# Patient Record
Sex: Male | Born: 1990 | Race: Black or African American | Hispanic: No | Marital: Single
Health system: Midwestern US, Community
[De-identification: ages and names within clinical notes are randomized; demographics above are authoritative.]

---

## 2013-12-13 ENCOUNTER — Emergency Department (INDEPENDENT_AMBULATORY_CARE_PROVIDER_SITE_OTHER)
Admission: EM | Admit: 2013-12-13 | Discharge: 2013-12-13 | Disposition: A | Discharge status: Home / Self Care | Payer: Self-pay | Source: Home / Self Care | Attending: Family Medicine | Admitting: Family Medicine

## 2013-12-13 ENCOUNTER — Encounter (HOSPITAL_COMMUNITY): Payer: Self-pay | Admitting: Emergency Medicine

## 2013-12-13 DIAGNOSIS — K029 Dental caries, unspecified: Secondary | ICD-10-CM

## 2013-12-13 DIAGNOSIS — G8929 Other chronic pain: Secondary | ICD-10-CM

## 2013-12-13 DIAGNOSIS — K089 Disorder of teeth and supporting structures, unspecified: Secondary | ICD-10-CM

## 2013-12-13 MED ORDER — NAPROXEN 375 MG PO TABS: 375.0000 mg | ORAL_TABLET | Freq: Two times a day (BID) | ORAL | Status: DC

## 2013-12-13 MED ORDER — LIDOCAINE VISCOUS 2 % MT SOLN
OROMUCOSAL | Status: DC
Start: 2013-12-13 — End: 2014-12-11

## 2013-12-13 NOTE — ED Provider Notes (Signed)
CSN: [id number]     Arrival date & time 12/13/13  1636 History   First MD Initiated Contact with Patient 12/13/13 1648     No chief complaint on file.  (Consider location/radiation/quality/duration/timing/severity/associated sxs/prior Treatment) HPI Comments: Patient reports several years of dental discomfort as a result of multiple dental carries. States he has finally become frustrated enough about not having dental provider [facility]. Patient is a smoker. Denies fever, facial swelling, gumline swelling or drainage. Using ibuprofen and tylenol at home with limited relief.   Patient is a 23 y.o. male presenting with tooth pain. The history is provided by the patient.  Dental Pain   No past medical history on file. No past surgical history on file. No family history on file. History  Substance Use Topics  . Smoking status: Not on file  . Smokeless tobacco: Not on file  . Alcohol Use: Not on file    Review of Systems  All other systems reviewed and are negative.    Allergies  Review of patient's allergies indicates not on file.  Home Medications  No current outpatient prescriptions on file. BP 128/81  Pulse 114  Temp(Src) 99 F (37.2 C) (Oral)  Resp 14  SpO2 99% Physical Exam  Nursing note and vitals reviewed. Constitutional: He is oriented to person, place, and time. He appears well-developed and well-nourished. No distress.  HENT:  Head: Normocephalic and atraumatic.  Right Ear: External ear normal.  Left Ear: External ear normal.  Nose: Nose normal.  Mouth/Throat: Uvula is midline and oropharynx is clear and moist. He does not have dentures. No oral lesions. No trismus in the jaw. Dental caries present. No dental abscesses, uvula swelling or lacerations.  Multiple dental carries, both upper and lower teeth.   Eyes: Conjunctivae are normal. No scleral icterus.  Neck: Normal range of motion. Neck supple.  Cardiovascular: Normal rate.    Pulmonary/Chest: Effort normal.  Lymphadenopathy:    He has no cervical adenopathy.  Neurological: He is alert and oriented to person, place, and time.  Skin: Skin is warm and dry.  Psychiatric: He has a normal mood and affect. His behavior is normal.    ED Course  Procedures (including critical care time) Labs Review Labs Reviewed - No data to display Imaging Review No results found.  EKG Interpretation    Date/Time:    Ventricular Rate:    PR Interval:    QRS Duration:   QT Interval:    QTC Calculation:   R Axis:     Text Interpretation:              MDM  Will provide Rx for viscous lidocaine and naprosyn and provide contact information for local dentist. No clinical evidence of dental abscess.   Jess BartersJennifer Lee Falcon Heights[place], [place] 12/13/13 [phone]

## 2013-12-13 NOTE — Discharge Instructions (Signed)
Dental Care and Dentist Visits Dental care supports good overall health. Regular dental visits can also help you avoid dental pain, bleeding, infection, and other more serious health problems in the future. It is important to keep the mouth healthy because diseases in the teeth, gums, and other oral tissues can spread to other areas of the body. Some problems, such as diabetes, heart disease, and pre-term labor have been associated with poor oral health.  See your dentist every 6 months. If you experience emergency problems such as a toothache or broken tooth, go to the dentist right away. If you see your dentist regularly, you may catch problems early. It is easier to be treated for problems in the early stages.  WHAT TO EXPECT AT A DENTIST VISIT  Your dentist will look for many common oral health problems and recommend proper treatment. At your regular dental visit, you can expect:  Gentle cleaning of the teeth and gums. This includes scraping and polishing. This helps to remove the sticky substance around the teeth and gums (plaque). Plaque forms in the mouth shortly after eating. Over time, plaque hardens on the teeth as tartar. If tartar is not removed regularly, it can cause problems. Cleaning also helps remove stains.  Periodic X-rays. These pictures of the teeth and supporting bone will help your dentist assess the health of your teeth.  Periodic fluoride treatments. Fluoride is a natural mineral shown to help strengthen teeth. Fluoride treatmentinvolves applying a fluoride gel or varnish to the teeth. It is most commonly done in children.  Examination of the mouth, tongue, jaws, teeth, and gums to look for any oral health problems, such as:  Cavities (dental caries). This is decay on the tooth caused by plaque, sugar, and acid in the mouth. It is best to catch a cavity when it is small.  Inflammation of the gums caused by plaque buildup (gingivitis).  Problems with the mouth or malformed  or misaligned teeth.  Oral cancer or other diseases of the soft tissues or jaws. KEEP YOUR TEETH AND GUMS HEALTHY For healthy teeth and gums, follow these general guidelines as well as your dentist's specific advice:  Have your teeth professionally cleaned at the dentist every 6 months.  Brush twice daily with a fluoride toothpaste.  Floss your teeth daily.  Ask your dentist if you need fluoride supplements, treatments, or fluoride toothpaste.  Eat a healthy diet. Reduce foods and drinks with added sugar.  Avoid smoking. TREATMENT FOR ORAL HEALTH PROBLEMS If you have oral health problems, treatment varies depending on the conditions present in your teeth and gums.  Your caregiver will most likely recommend good oral hygiene at each visit.  For cavities, gingivitis, or other oral health disease, your caregiver will perform a procedure to treat the problem. This is typically done at a separate appointment. Sometimes your caregiver will refer you to another dental specialist for specific tooth problems or for surgery. SEEK IMMEDIATE DENTAL CARE IF:  You have pain, bleeding, or soreness in the gum, tooth, jaw, or mouth area.  A permanent tooth becomes loose or separated from the gum socket.  You experience a blow or injury to the mouth or jaw area. Document Released: 07/16/2011 Document Revised: 01/26/2012 Document Reviewed: 07/16/2011 [employer] Patient Information 2014 [employer].  

## 2013-12-13 NOTE — ED Notes (Signed)
C/o  Dental pain off/on.  More severe pain in the past two weeks and causing headaches.  No relief with otc pain meds.

## 2013-12-15 NOTE — ED Provider Notes (Signed)
Medical screening examination/treatment/procedure(s) were performed by resident physician or non-physician practitioner and as supervising physician I was immediately available for consultation/collaboration.   Caitlain Tweed DOUGLAS MD.   Gaberial Cada D Shawnell Dykes, MD 12/15/13 2018 

## 2014-12-11 ENCOUNTER — Emergency Department (HOSPITAL_BASED_OUTPATIENT_CLINIC_OR_DEPARTMENT_OTHER): Payer: No Typology Code available for payment source

## 2014-12-11 ENCOUNTER — Emergency Department (HOSPITAL_BASED_OUTPATIENT_CLINIC_OR_DEPARTMENT_OTHER)
Admission: EM | Admit: 2014-12-11 | Discharge: 2014-12-11 | Disposition: A | Discharge status: Home / Self Care | Payer: No Typology Code available for payment source | Attending: Emergency Medicine | Admitting: Emergency Medicine

## 2014-12-11 ENCOUNTER — Encounter (HOSPITAL_BASED_OUTPATIENT_CLINIC_OR_DEPARTMENT_OTHER): Payer: Self-pay | Admitting: *Deleted

## 2014-12-11 DIAGNOSIS — S3992XA Unspecified injury of lower back, initial encounter: Secondary | ICD-10-CM

## 2014-12-11 DIAGNOSIS — Y9289 Other specified places as the place of occurrence of the external cause: Secondary | ICD-10-CM | POA: Insufficient documentation

## 2014-12-11 DIAGNOSIS — Y9389 Activity, other specified: Secondary | ICD-10-CM | POA: Insufficient documentation

## 2014-12-11 DIAGNOSIS — S199XXA Unspecified injury of neck, initial encounter: Secondary | ICD-10-CM | POA: Insufficient documentation

## 2014-12-11 DIAGNOSIS — Z720 Tobacco use: Secondary | ICD-10-CM | POA: Insufficient documentation

## 2014-12-11 DIAGNOSIS — Z79899 Other long term (current) drug therapy: Secondary | ICD-10-CM | POA: Insufficient documentation

## 2014-12-11 DIAGNOSIS — Y998 Other external cause status: Secondary | ICD-10-CM | POA: Insufficient documentation

## 2014-12-11 DIAGNOSIS — K029 Dental caries, unspecified: Secondary | ICD-10-CM | POA: Insufficient documentation

## 2014-12-11 MED ORDER — CYCLOBENZAPRINE HCL 10 MG PO TABS
5.0000 mg | ORAL_TABLET | Freq: Once | ORAL | Status: AC
  Administered 2014-12-11: 5 mg via ORAL
  Filled 2014-12-11: qty 1

## 2014-12-11 MED ORDER — HYDROCODONE-ACETAMINOPHEN 5-325 MG PO TABS
2.0000 | ORAL_TABLET | Freq: Once | ORAL | Status: AC
  Administered 2014-12-11: 2 via ORAL
  Filled 2014-12-11: qty 2

## 2014-12-11 MED ORDER — IBUPROFEN 800 MG PO TABS
800.0000 mg | ORAL_TABLET | Freq: Once | ORAL | Status: AC
  Administered 2014-12-11: 800 mg via ORAL
  Filled 2014-12-11: qty 1

## 2014-12-11 MED ORDER — HYDROCODONE-ACETAMINOPHEN 5-325 MG PO TABS: 1.0000 | ORAL_TABLET | Freq: Four times a day (QID) | ORAL | Status: DC | PRN

## 2014-12-11 MED ORDER — AMOXICILLIN 500 MG PO CAPS
500.0000 mg | ORAL_CAPSULE | Freq: Once | ORAL | Status: AC
  Administered 2014-12-11: 500 mg via ORAL
  Filled 2014-12-11: qty 1

## 2014-12-11 MED ORDER — AMOXICILLIN 500 MG PO CAPS: 1000.0000 mg | ORAL_CAPSULE | Freq: Two times a day (BID) | ORAL | Status: DC
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
## 2014-12-11 NOTE — Discharge Instructions (Signed)
Take motrin for pain.  Take vicodin for severe pain. DO NOT drive with it.   Call dentist for appointment.   Take amoxicillin twice a day for a week.   Return to ER if you have severe pain, vomiting, headache, fever.
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
## 2014-12-11 NOTE — ED Notes (Signed)
MD at bedside. 

## 2014-12-11 NOTE — ED Notes (Signed)
D/c home with ride- rx given for amoxicillin and norco- not for work given

## 2015-01-04 ENCOUNTER — Emergency Department (HOSPITAL_BASED_OUTPATIENT_CLINIC_OR_DEPARTMENT_OTHER)
Admission: EM | Admit: 2015-01-04 | Discharge: 2015-01-04 | Disposition: A | Discharge status: Home / Self Care | Payer: Self-pay | Attending: Emergency Medicine | Admitting: Emergency Medicine

## 2015-01-04 ENCOUNTER — Encounter (HOSPITAL_BASED_OUTPATIENT_CLINIC_OR_DEPARTMENT_OTHER): Payer: Self-pay

## 2015-01-04 DIAGNOSIS — Z720 Tobacco use: Secondary | ICD-10-CM | POA: Insufficient documentation

## 2015-01-04 DIAGNOSIS — R000 Tachycardia, unspecified: Secondary | ICD-10-CM | POA: Insufficient documentation

## 2015-01-04 DIAGNOSIS — K029 Dental caries, unspecified: Secondary | ICD-10-CM | POA: Insufficient documentation

## 2015-01-04 MED ORDER — NAPROXEN 500 MG PO TABS: 500.0000 mg | ORAL_TABLET | Freq: Two times a day (BID) | ORAL | Status: DC

## 2015-01-04 MED ORDER — AMOXICILLIN 500 MG PO CAPS: 500.0000 mg | ORAL_CAPSULE | Freq: Three times a day (TID) | ORAL | Status: DC

## 2015-01-04 NOTE — ED Provider Notes (Signed)
CSN: [id number]     Arrival date & time 01/04/15  1409 History   First MD Initiated Contact with Patient 01/04/15 1527     Chief Complaint  Patient presents with  . Dental Pain     (Consider location/radiation/quality/duration/timing/severity/associated sxs/prior Treatment) Patient is a 24 y.o. male presenting with tooth pain. The history is provided by the patient.  Dental Pain Location:  Lower Lower teeth location:  18/LL 2nd molar, 31/RL 2nd molar, 17/LL 3rd molar and 32/RL 3rd molar  Peter BeachRicky Barnhardt is a 24 y.o. male who presents to the ED with dental pain. He has several teeth that are decayed and causing problems. He states that they hurt so back they are causing his head to hurt.   History reviewed. No pertinent past medical history. History reviewed. No pertinent past surgical history. No family history on file. History  Substance Use Topics  . Smoking status: Current Every Day Smoker -- 1.00 packs/day    Types: Cigarettes  . Smokeless tobacco: Not on file  . Alcohol Use: No    Review of Systems Negative except as stated in HPI   Allergies  Review of patient's allergies indicates no known allergies.  Home Medications   Prior to Admission medications   Medication Sig Start Date End Date Taking? Authorizing Provider  amoxicillin (AMOXIL) 500 MG capsule Take 1 capsule (500 mg total) by mouth 3 (three) times daily. 01/04/15   Hope Orlene OchM Neese, NP  naproxen (NAPROSYN) 500 MG tablet Take 1 tablet (500 mg total) by mouth 2 (two) times daily. 01/04/15   Hope Orlene OchM Neese, NP   BP 129/81 mmHg  Pulse 103  Temp(Src) 97.8 F (36.6 C) (Oral)  Resp 14  Ht 6\' 1"  (1.854 m)  Wt 150 lb (68.04 kg)  BMI 19.79 kg/m2  SpO2 99% Physical Exam  Constitutional: He is oriented to person, place, and time. He appears well-developed and well-nourished. No distress.  HENT:  Head: Normocephalic.  Right Ear: Tympanic membrane normal.  Left Ear: Tympanic membrane normal.  Mouth/Throat: Uvula is  midline and oropharynx is clear and moist. Dental caries present.    Lower second molars decayed, bilateral 3rd molars partially erupted and tender on exam.   Eyes: EOM are normal.  Neck: Neck supple.  Cardiovascular: Tachycardia present.   Pulmonary/Chest: Effort normal.  Musculoskeletal: Normal range of motion.  Lymphadenopathy:    He has no cervical adenopathy.  Neurological: He is alert and oriented to person, place, and time. No cranial nerve deficit.  Skin: Skin is warm and dry.  Psychiatric: He has a normal mood and affect. His behavior is normal.  Nursing note and vitals reviewed.   ED Course  Procedures (including critical care time) Labs Review   MDM  24 y.o. male with dental pain due to caries. Stable for d/c without fever and does not appear toxic. Will treat with antibiotics and pain medication. He will follow up with a dentist as scheduled.   Final diagnoses:  Pain due to dental caries       Janne NapoleonHope M Neese, NP 01/08/15 [phone]  Glynn OctaveStephen Rancour, MD 01/08/15 [phone]

## 2015-01-04 NOTE — Discharge Instructions (Signed)
You must call Dr. Mayford Knifeurner for a follow up appointment to have the decayed teeth filled or extracted. Take the medications as directed.

## 2015-01-04 NOTE — ED Notes (Signed)
Ptc/o toothaches states he has several teeth that need to be pulled-also c/o HA

## 2015-02-18 ENCOUNTER — Encounter (HOSPITAL_BASED_OUTPATIENT_CLINIC_OR_DEPARTMENT_OTHER): Payer: Self-pay

## 2015-02-18 ENCOUNTER — Emergency Department (HOSPITAL_BASED_OUTPATIENT_CLINIC_OR_DEPARTMENT_OTHER)
Admission: EM | Admit: 2015-02-18 | Discharge: 2015-02-18 | Disposition: A | Discharge status: Home / Self Care | Payer: Self-pay | Attending: Emergency Medicine | Admitting: Emergency Medicine

## 2015-02-18 DIAGNOSIS — K0889 Other specified disorders of teeth and supporting structures: Secondary | ICD-10-CM

## 2015-02-18 DIAGNOSIS — Z720 Tobacco use: Secondary | ICD-10-CM | POA: Insufficient documentation

## 2015-02-18 DIAGNOSIS — K0381 Cracked tooth: Secondary | ICD-10-CM | POA: Insufficient documentation

## 2015-02-18 DIAGNOSIS — K029 Dental caries, unspecified: Secondary | ICD-10-CM | POA: Insufficient documentation

## 2015-02-18 DIAGNOSIS — K088 Other specified disorders of teeth and supporting structures: Secondary | ICD-10-CM | POA: Insufficient documentation

## 2015-02-18 MED ORDER — AMOXICILLIN 500 MG PO CAPS: 500.0000 mg | ORAL_CAPSULE | Freq: Three times a day (TID) | ORAL | Status: DC

## 2015-02-18 MED ORDER — IBUPROFEN 600 MG PO TABS: 600.0000 mg | ORAL_TABLET | Freq: Four times a day (QID) | ORAL | Status: AC | PRN

## 2015-02-18 MED ORDER — KETOROLAC TROMETHAMINE 60 MG/2ML IM SOLN
60.0000 mg | Freq: Once | INTRAMUSCULAR | Status: AC
  Administered 2015-02-18: 60 mg via INTRAMUSCULAR
  Filled 2015-02-18: qty 2

## 2015-02-18 NOTE — Discharge Instructions (Signed)
Ibuprofen for pain. Amoxil for infection. Follow up with a dentist as referred.    Dental Pain A tooth ache may be caused by cavities (tooth decay). Cavities expose the nerve of the tooth to air and hot or cold temperatures. It may come from an infection or abscess (also called a boil or furuncle) around your tooth. It is also often caused by dental caries (tooth decay). This causes the pain you are having. DIAGNOSIS  Your caregiver can diagnose this problem by exam. TREATMENT   If caused by an infection, it may be treated with medications which kill germs (antibiotics) and pain medications as prescribed by your caregiver. Take medications as directed.  Only take over-the-counter or prescription medicines for pain, discomfort, or fever as directed by your caregiver.  Whether the tooth ache today is caused by infection or dental disease, you should see your dentist as soon as possible for further care. SEEK MEDICAL CARE IF: The exam and treatment you received today has been provided on an emergency basis only. This is not a substitute for complete medical or dental care. If your problem worsens or new problems (symptoms) appear, and you are unable to meet with your dentist, call or return to this location. SEEK IMMEDIATE MEDICAL CARE IF:   You have a fever.  You develop redness and swelling of your face, jaw, or neck.  You are unable to open your mouth.  You have severe pain uncontrolled by pain medicine. MAKE SURE YOU:   Understand these instructions.  Will watch your condition.  Will get help right away if you are not doing well or get worse. Document Released: 11/03/2005 Document Revised: 01/26/2012 Document Reviewed: 06/21/2008 [facility] Patient Information 2015 [place]Care, [place]. This information is not intended to replace advice given to you by your health care provider. Make sure you discuss any questions you have with your health care provider.   Emergency Department Resource  Guide 1) Find a Doctor and Pay Out of Pocket Although you won't have to find out who is covered by your insurance plan, it is a good idea to ask around and get recommendations. You will then need to call the office and see if the doctor you have chosen will accept you as a new patient and what types of options they offer for patients who are self-pay. Some doctors offer discounts or will set up payment plans for their patients who do not have insurance, but you will need to ask so you aren't surprised when you get to your appointment.  2) Contact Your Local Health Department Not all health departments have doctors that can see patients for sick visits, but many do, so it is worth a call to see if yours does. If you don't know where your local health department is, you can check in your phone book. The CDC also has a tool to help you locate your state's health department, and many state websites also have listings of all of their local health departments.  3) Find a Walk-in Clinic If your illness is not likely to be very severe or complicated, you may want to try a walk in clinic. These are popping up all over the country in pharmacies, drugstores, and shopping centers. They're usually staffed by nurse practitioners or physician assistants that have been trained to treat common illnesses and complaints. They're usually fairly quick and inexpensive. However, if you have serious medical issues or chronic medical problems, these are probably not your best option.  No Primary  Care Doctor: - Call Health Connect at  [phone] - they can help you locate a primary care doctor that  accepts your insurance, provides certain services, etc. - Physician Referral Service- [phone]  Chronic Pain Problems: Organization         Address  Phone   Notes  [facility]  [phone] Patients need to be referred by their primary care doctor.   Medication Assistance: Organization          Address  Phone   Notes  [facility] Medication [facility] [place], [place] [address] [phone] --Must be a resident of [facility] -- Must have NO insurance coverage whatsoever (no Medicaid/ Medicare, etc.) -- The pt. MUST have a primary care doctor that directs their care regularly and follows them in the community   [facility]  [phone]   [employer]  [phone]    Agencies that provide inexpensive medical care: Organization         Address  Phone   Notes  [place]  [phone]   [facility]    [phone]   [facility] [place], [place] [address] [phone]   [place] [address] [place] [phone]   [facility]anned Parenthood    [phone]   [facility]    [phone]   [facility] [place] [place] [address], [place] Phone:  [phone], Fax:  [phone] Hours of Operation:  9 am - 6 pm, M-F.  Also accepts Medicaid/Medicare and self-pay.  [facility] for [place] [address], [address], [place] Phone: [phone], Fax: [phone]. Hours of Operation:  8:30 am - 5:30 pm, M-F.  Also accepts Medicaid and self-pay.  [facility] [place] [address], [place] Phone: [phone]   [place], [place], [place] [phone], Ext. [phone] Mondays & Thursdays: 7-9 AM.  First 15 patients are seen on a first come, first serve basis.    [place] Providers:  Organization         Address  Phone   Notes  [facility] [address], [place] [phone] Also accepts self-pay patients.  [facility] [address], [place]  [phone]   [place], [address], [place] [phone]   [facility] [address] [place] [phone]   Lucianne Lei [address], [place]   [phone] Only accepts [place] Access [place] patients after they have their name applied to their card.   Self-Pay (no insurance) in [facility]:  Organization         Address  Phone   Notes  Sickle Cell Patients, [facility] [place] [phone]   [facility] [place] [phone]   [facility] [place]  [place], [place], [place] [phone]   [facility]/Dr. Osei-Bonsu  [address] [place] or [address], [place] [phone] Phone number for both [place] and [place] locations is the same.  [facility] [address], [place] [phone]  [facility] [address] [place] or [address] [phone]   [facility] [place], [place] [phone], phone; [phone], fax Sees patients 1st and 3rd Saturday of every month.  Must not qualify for public or private insurance (i.e. Medicaid, Medicare, [place] Health Choice, Veterans' Benefits)  Household income should be no more than 200% of the poverty level The clinic cannot treat you if you are pregnant or think you are pregnant  Sexually transmitted diseases are not treated at the clinic.    Dental Care: Organization         Address  Phone  Notes  [facility] of [facility] [place] [phone] Accepts children up to age 51 who are enrolled in [place] or [place]; pregnant women with a Medicaid card; and children who have applied for Medicaid or [place] Health Choice, but were declined, whose parents can pay a reduced fee at time of service.  [facility] of [facility]  [address], [place] [phone] Accepts children up to age 76 who are enrolled in [place] or [place]; pregnant women with a Medicaid card; and children who have applied for Medicaid or [place] Health Choice, but were declined, whose parents can pay a reduced fee at time of service.  [facility] Adult [facility] Access PRO[facility]  [place] [phone] Patients are seen by appointment only. Walk-ins are not accepted. [place] will see patients 50 years of age and older. Monday - Tuesday (8am-5pm) Most Wednesdays (8:30-5pm) $30 per visit, cash only  [facility] Adult Dental Access PROGRAM  [address], [facility] [phone] Patients are seen by appointment only. Walk-ins are not accepted. [place] will see patients 44 years of age and older. One Wednesday Evening (Monthly: Volunteer Based).  $30 per visit, cash only  [employer]  [phone] for adults; Children under age 46, call Graduate Pediatric Dentistry at [phone]. Children aged 6-14, please call [phone] to request a pediatric application.  Dental services are provided in all areas of dental care including fillings, crowns and bridges, complete and partial dentures, implants, gum treatment, root canals, and extractions. Preventive care is also provided. Treatment is provided to both adults and children. Patients are selected via a lottery and there is often a waiting list.   [facility] [address] [place]  [phone] [email or web address]   [facility] [address] [place], [place] [phone], Ext. [phone] Second and Fourth Thursday of each month, opens at 6:30 AM; Clinic ends at 9 AM.  Patients are seen on a first-come first-served basis, and a limited number are seen during each clinic.   [facility]  [address] Hillard Danker [place], [place] [phone]   Eligibility Requirements You must  have lived in [place], [place], or [place] counties for at least the last three months.   You cannot be eligible for state or federal sponsored [employer], including [employer], [place], or [employer].   You generally cannot be eligible for healthcare insurance through your employer.    How to apply: Eligibility screenings are held every Tuesday and Wednesday afternoon from 1:00 pm until 4:00 pm. You do not need an appointment for the interview!  [facility] [address] [place], [place]  [place] [facility]  [phone]   °[place] [facility]  [phone]   °[facility]  336-570-6415   ° °Behavioral Health Resources in the Community: °Intensive Outpatient Programs °Organization         Address  Phone  Notes  °[facility] [address], [place], [place] [phone]   °[facility] [address], [place], [place] [phone]   °[facility] [address], [place], [place] ° 336-882-2125   °[facility] [address],  °[place], [place] 1-800-853-5163 or 336-641-4981   °Substance Abuse Resources °Organization         Address  Phone  Notes  °[facility]  336-882-2125   °[facility]  336-784-9470   °[facility]  336-285-9073   °[facility]  336-845-3988   °Residential & Outpatient Substance Abuse Program  1-800-659-3381   °Psychological Services °Organization         Address  Phone  Notes  °[facility]  336- [phone]   °[facility]  336- 378-7881   °[facility] [address], [place] 1-800-853-5163 or 336-641-4981   ° °Mobile Crisis Teams °Organization         Address  Phone  Notes  °[facility], Mobile Crisis Care Unit  1-877-626-1772   °Assertive °[facility] Services ° [address] [place], [place] 336-834-9664   °Sharon  DeEsch [address] °[place] 336-554-5454   ° °Self-Help/Support Groups °Organization         Address  Phone             Notes  °[facility] [place] - variety of support groups  336- 373-1402 Call for more information  °Narcotics Anonymous (NA), [facility] [address], °[place]  2 meetings at this location  ° °Residential Treatment Programs °Organization         Address  Phone  Notes  °[facility] [address],    °[place]  1-866-801-8205   °[facility] ° [address], [place], [place] [phone]   °[facility] [address], [place] 336-845-3988 Admissions: 8am-3pm M-F  °[facility] [address]    °[place], [place] 336-841-1104   °[facility] [address], [place], [place] 336-379-7146   °[facility] [address]  °[place], [place] 336-285-9073   °[facility] - Intensive Outpatient [address], [place], [place] 336-852-3033   °[facility] ([facility] [address]  °[place], [place] 1-877-615-2722 or 336-784-9470   °[facility] ([facility]) [address] [place], [place] 336-227-7417 Accepts Medicaid  °[facility] [address]  °[place] 1-800-659-3381 Substance Abuse/Addiction Treatment  ° °[place] [facility] Health Resources °Organization         Address  Phone  Notes  °[facility]  (888) 581-9988   °Julie Brannon, PhD [address] [place], [place]   (336) 349-5553 or (336) 951-0000   °[facility]   [address] °[place],  (336) 349-4454   °[facility] [address], [place],  (336) 342-8316 Insurance/Medicaid/sponsorship through [facility]  °[facility] [address]                                      [place], [place] [phone] [place], [place] [phone]    Dr. Adele Schilder  [phone]   [facility] Dept. 1) [address] [place] 2) [place] 3)  [address], [place] [phone]  [phone]   [place] [phone] or [phone] (After Hours)

## 2015-02-18 NOTE — ED Provider Notes (Signed)
CSN: [id number]     Arrival date & time 02/18/15  1250 History   First MD Initiated Contact with Patient 02/18/15 1431     Chief Complaint  Patient presents with  . Dental Pain     (Consider location/radiation/quality/duration/timing/severity/associated sxs/prior Treatment) HPI Peter Jensen is a 24 y.o. male presents to emergency department complaining of diffuse dental pain. Patient states he has multiple bad teeth and has had toothache in the past for which he has been seen here. He states that his pain is gradually worsening over last several days. He states is painful to drink anything or eat anything. He denies any facial swelling. He denies fever or chills. He has been taking over-the-counter pain medications which are not helping. He does not have a dentist or an insurance so he is unable to see anyone. He denies any other complaints.  History reviewed. No pertinent past medical history. History reviewed. No pertinent past surgical history. No family history on file. History  Substance Use Topics  . Smoking status: Current Every Day Smoker -- 1.00 packs/day    Types: Cigarettes  . Smokeless tobacco: Not on file  . Alcohol Use: No    Review of Systems  Constitutional: Negative for fever and chills.  HENT: Positive for dental problem. Negative for facial swelling and sore throat.       Allergies  Review of patient's allergies indicates no known allergies.  Home Medications   Prior to Admission medications   Not on File   BP 135/79 mmHg  Pulse 68  Temp(Src) 98.8 F (37.1 C) (Oral)  Resp 18  Ht 6\' 2"  (1.88 m)  Wt 145 lb (65.772 kg)  BMI 18.61 kg/m2  SpO2 100% Physical Exam  Constitutional: He appears well-developed and well-nourished. No distress.  HENT:  Head: Normocephalic and atraumatic.  Diffuse dental decay with multiple broken off teeth and dental cavities. Gums appeared to be normal, no swelling, no obvious abscess. No trismus. No swelling under the tongue.   Eyes: Conjunctivae are normal.  Neck: Neck supple.  Cardiovascular: Normal rate, regular rhythm and normal heart sounds.   Pulmonary/Chest: Effort normal. No respiratory distress. He has no wheezes. He has no rales.  Musculoskeletal: He exhibits no edema.  Neurological: He is alert.  Skin: Skin is warm and dry.  Nursing note and vitals reviewed.   ED Course  Procedures (including critical care time) Labs Review Labs Reviewed - No data to display  Imaging Review No results found.   EKG Interpretation None      MDM   Final diagnoses:  Pain, dental     patient is here with dental pain. Pain is diffuse and multiple teeth all over his mouth. There is multiple cavities and chipped teeth on exam. Will start on amoxicillin for possible infection or gingivitis, follow-up with a dentist. Referral given until on-call dentist, instructed to call him within 24 hours. Patient voiced understanding. Toradol emergency department, ibuprofen for pain at home.  Filed Vitals:   02/18/15 1256  BP: 135/79  Pulse: 68  Temp: 98.8 F (37.1 C)  TempSrc: Oral  Resp: 18  Height: 6\' 2"  (1.88 m)  Weight: 145 lb (65.772 kg)  SpO2: 100%     Jaynie Crumbleatyana Arul Farabee, PA-C 02/18/15 1516  Blake DivineJohn Wofford, MD 02/18/15 1609

## 2015-02-18 NOTE — ED Notes (Signed)
Patient here with ongoing dental pain. Reports broken teeth bilateral and unable to afford dentist. Requesting pain meds and antibiotics

## 2015-02-18 NOTE — ED Notes (Signed)
Pt with poor dental hygiene, chipping to multiple teeth on bilateral upper and lower, extensive dental decay.  No swelling in jaw or neck area.  Airway patent.

## 2015-03-21 ENCOUNTER — Encounter (HOSPITAL_COMMUNITY): Payer: Self-pay

## 2015-03-21 ENCOUNTER — Emergency Department (INDEPENDENT_AMBULATORY_CARE_PROVIDER_SITE_OTHER)
Admission: EM | Admit: 2015-03-21 | Discharge: 2015-03-21 | Disposition: A | Discharge status: Home / Self Care | Payer: Self-pay | Source: Home / Self Care | Attending: Family Medicine | Admitting: Family Medicine

## 2015-03-21 DIAGNOSIS — K029 Dental caries, unspecified: Secondary | ICD-10-CM

## 2015-03-21 MED ORDER — DICLOFENAC SODIUM 75 MG PO TBEC: 75.0000 mg | DELAYED_RELEASE_TABLET | Freq: Two times a day (BID) | ORAL | Status: AC | PRN

## 2015-03-21 MED ORDER — AMOXICILLIN 500 MG PO CAPS: 500.0000 mg | ORAL_CAPSULE | Freq: Three times a day (TID) | ORAL | Status: AC

## 2015-03-21 NOTE — Discharge Instructions (Signed)
Thank you for coming in today.  [facility]:  [facility] [phone]- [phone] (ext [phone])  [phone] [place] [address]  Please call Dr. Lawrence Marseillesivils office [phone] or cell [phone] [address], [place]  Cost for tooth removal $200 includes exam, Xray, and extraction and follow up visit.  Bring list of current medications with you.   [facility] - 336 [phone]  [facility] [phone]  [address] [facility] [address]  [facility]  [address], [place], [place], [address]  [phone], Ext. [phone]  2nd and 4th Thursday of the month at 6:30am (Simple extractions only - no wisdom teeth or surgery) First come/First serve -First 10 clients served  [facility]([place], [place] and [place] residents only)  [address], [place], [place], [address]  336 [phone]  [facility]  336 [phone]  [facility]  [phone]  [facility]  [phone]  Please call [facility] at [phone] to get the details to get your tooth pulled.    Dental Pain Toothache is pain in or around a tooth. It may get worse with chewing or with cold or heat.  HOME CARE  Your dentist may use a numbing medicine during treatment. If so, you may need to avoid eating until the medicine wears off. Ask your dentist about this.  Only take medicine as told by your dentist or doctor.  Avoid chewing food near the painful tooth until after all treatment is done. Ask your dentist about this. GET HELP RIGHT AWAY IF:   The problem gets worse or new problems appear.  You have a fever.  There is redness and puffiness (swelling) of the face, jaw, or neck.  You cannot open your mouth.  There is pain in the jaw.  There is very bad pain that is not helped by medicine. MAKE SURE YOU:   Understand these instructions.  Will watch your condition.  Will get  help right away if you are not doing well or get worse. Document Released: 04/21/2008 Document Revised: 01/26/2012 Document Reviewed: 04/21/2008 [facility] Patient Information 2015 [place]Care, [place]LL[place]. This information is not intended to replace advice given to you by your health care provider. Make sure you discuss any questions you have with your health care provider.

## 2015-03-21 NOTE — ED Notes (Signed)
C/o broken tooth . Cannot afford a dentist, and [place]

## 2015-03-21 NOTE — ED Provider Notes (Signed)
Peter BeachRicky Jensen is a 24 y.o. male who presents to Urgent Care today for dental pain. Patient has multiple broken teeth. He notes chronic no pain. Pain is worsened recently and his left mandible. No fevers or chills vomiting or diarrhea. He is using dental wax which helps. No fevers or chills. Is an appointment with the dentist on the 20th   History reviewed. No pertinent past medical history. History reviewed. No pertinent past surgical history. History  Substance Use Topics  . Smoking status: Current Every Day Smoker -- 1.00 packs/day    Types: Cigarettes  . Smokeless tobacco: Not on file  . Alcohol Use: No   ROS as above Medications: No current facility-administered medications for this encounter.   Current Outpatient Prescriptions  Medication Sig Dispense Refill  . amoxicillin (AMOXIL) 500 MG capsule Take 1 capsule (500 mg total) by mouth 3 (three) times daily. 30 capsule 0  . diclofenac (VOLTAREN) 75 MG EC tablet Take 1 tablet (75 mg total) by mouth 2 (two) times daily as needed. 30 tablet 0  . ibuprofen (ADVIL,MOTRIN) 600 MG tablet Take 1 tablet (600 mg total) by mouth every 6 (six) hours as needed. 30 tablet 0   No Known Allergies   Exam:  BP 119/74 mmHg  Pulse 71  Temp(Src) 98.6 F (37 C) (Oral)  Resp 18  SpO2 100% Gen: Well NAD HEENT: EOMI,  MMM multiple broken teeth. Right lower molar tender to touch., Erythema present. No abscess visible. Lungs: Normal work of breathing. CTABL Heart: RRR no MRG Abd: NABS, Soft. Nondistended, Nontender Exts: Brisk capillary refill, warm and well perfused.   No results found for this or any previous visit (from the past 24 hour(s)). No results found.  Assessment and Plan: 24 y.o. male with dental pain. Treat with amoxicillin and diclofenac.  Discussed warning signs or symptoms. Please see discharge instructions. Patient expresses understanding.     Peter BongEvan S Page Pucciarelli, MD 03/21/15 1800
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
## 2019-08-30 ENCOUNTER — Inpatient Hospital Stay
Admit: 2019-08-30 | Discharge: 2019-08-30 | Disposition: A | Discharge status: Home / Self Care | Payer: MEDICAID | Attending: Emergency Medicine

## 2019-08-30 DIAGNOSIS — N342 Other urethritis: Secondary | ICD-10-CM

## 2019-08-30 LAB — URINE MICROSCOPIC
WBC, UA: >100 /hpf — ABNORMAL HIGH (ref 0–5)
WBC: >100 /hpf — ABNORMAL HIGH (ref 0–5)

## 2019-08-30 LAB — URINALYSIS W/ RFLX MICROSCOPIC
Bilirubin UA, confirm: NEGATIVE
Bilirubin Urine: NEGATIVE
Glucose, Ur: NEGATIVE mg/dL
Glucose: NEGATIVE mg/dL
Ketone: 40 mg/dL — AB
Ketones, Urine: 40 mg/dL — AB
Nitrite, Urine: NEGATIVE
Nitrites: NEGATIVE
Protein, UA: 30 mg/dL — AB
Protein: 30 mg/dL — AB
Specific Gravity, UA: >1.03 NA — ABNORMAL HIGH (ref 1.003–1.030)
Specific gravity: 1.035
Specific gravity: >1.03 — ABNORMAL HIGH (ref 1.003–1.030)
Urobilinogen, UA, POCT: 1 EU/dL (ref 0.2–1.0)
Urobilinogen: 1 EU/dL (ref 0.2–1.0)
pH (UA): 6 (ref 5.0–8.0)
pH, UA: 6 (ref 5.0–8.0)

## 2019-08-30 MED ORDER — DOXYCYCLINE 100 MG CAP
100 mg | ORAL | Status: AC
Start: 2019-08-30 — End: 2019-08-30
  Administered 2019-08-30: 22:00:00 via ORAL

## 2019-08-30 MED ORDER — DOXYCYCLINE HYCLATE 100 MG TAB
100 mg | ORAL_TABLET | Freq: Two times a day (BID) | ORAL | 0 refills | Status: AC
Start: 2019-08-30 — End: 2019-09-09

## 2019-08-30 MED ORDER — LEVOFLOXACIN 500 MG TAB
500 mg | ORAL | Status: AC
Start: 2019-08-30 — End: 2019-08-30
  Administered 2019-08-30: 22:00:00 via ORAL

## 2019-08-30 MED FILL — LEVOFLOXACIN 500 MG TAB: 500 mg | ORAL | Qty: 1

## 2019-08-30 MED FILL — DOXYCYCLINE 100 MG CAP: 100 mg | ORAL | Qty: 1

## 2019-08-30 NOTE — ED Notes (Signed)
Patient left ED ambulatory in good condition.  All discharge instructions were printed and given to patient and verbal education was performed.
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
## 2019-08-30 NOTE — ED Provider Notes (Signed)
EMERGENCY DEPARTMENT HISTORY AND PHYSICAL EXAM      Date: 08/30/2019  Patient Name: Wesley Sanders    History of Presenting Illness     Chief Complaint   Patient presents with   ??? Penile Discharge       History Provided By: Patient    HPI: Wesley Sanders, 28 y.o. male with a past medical history significant No significant past medical history presents to the ED with cc of yellow penile discharge and dysuria beginning 3 days ago.  Unprotected intercourse    There are no other complaints, changes, or physical findings at this time.    PCP: None    No current facility-administered medications on file prior to encounter.      No current outpatient medications on file prior to encounter.       Past History     Past Medical History:  History reviewed. No pertinent past medical history.    Past Surgical History:  History reviewed. No pertinent surgical history.    Family History:  History reviewed. No pertinent family history.    Social History:  Social History     Tobacco Use   ??? Smoking status: Current Every Day Smoker     Packs/day: 0.25   ??? Smokeless tobacco: Never Used   Substance Use Topics   ??? Alcohol use: Yes     Comment: 3x a week   ??? Drug use: Never       Allergies:  No Known Allergies      Review of Systems   All other systems negative  Review of Systems    Physical Exam   Young African-American male no acute distress  Physical Exam  Vitals signs and nursing note reviewed.   Constitutional:       General: He is not in acute distress.     Appearance: Normal appearance. He is not ill-appearing or toxic-appearing.   HENT:      Head: Normocephalic and atraumatic.      Nose: Nose normal.      Mouth/Throat:      Mouth: Mucous membranes are moist.   Eyes:      Extraocular Movements: Extraocular movements intact.      Conjunctiva/sclera: Conjunctivae normal.      Pupils: Pupils are equal, round, and reactive to light.   Neck:      Musculoskeletal: Normal range of motion and neck supple. No neck  rigidity or muscular tenderness.      Vascular: No carotid bruit.   Cardiovascular:      Rate and Rhythm: Normal rate and regular rhythm.      Pulses: Normal pulses.      Heart sounds: Normal heart sounds.   Pulmonary:      Effort: Pulmonary effort is normal. No respiratory distress.      Breath sounds: Normal breath sounds. No wheezing, rhonchi or rales.   Abdominal:      General: Abdomen is flat. There is no distension.      Palpations: Abdomen is soft. There is no mass.      Tenderness: There is no abdominal tenderness. There is no right CVA tenderness, left CVA tenderness, guarding or rebound.      Hernia: No hernia is present.   Genitourinary:     Comments: Purulent yellow penile discharge nontender epididymis testicles  Musculoskeletal: Normal range of motion.   Skin:     General: Skin is warm and dry.   Neurological:  General: No focal deficit present.      Mental Status: He is alert and oriented to person, place, and time. Mental status is at baseline.   Psychiatric:         Mood and Affect: Mood normal.         Behavior: Behavior normal.         Thought Content: Thought content normal.         Diagnostic Study Results     Labs -   No results found for this or any previous visit (from the past 12 hour(s)).    Radiologic Studies -   @lastxrresult @  CT Results  (Last 48 hours)    None        CXR Results  (Last 48 hours)    None            Medical Decision Making   I am the first provider for this patient.    I reviewed the vital signs, available nursing notes, past medical history, past surgical history, family history and social history.    Vital Signs-Reviewed the patient's vital signs.  Patient Vitals for the past 12 hrs:   Temp Pulse Resp BP SpO2   08/30/19 1612 98.1 ??F (36.7 ??C) 75 16 (!) 130/92 99 %       Records Reviewed: Nursing Notes    The patient presents with urethritis with a differential diagnosis of gonorrhea chlamydia      Provider Notes (Medical Decision Making):    Unprotected intercourse now with urethritis gonorrhea versus chlamydia  MDM         ED Course:   Initial assessment performed. The patients presenting problems have been discussed, and they are in agreement with the care plan formulated and outlined with them.  I have encouraged them to ask questions as they arise throughout their visit.           PROCEDURES  Procedures   We will treat for gonorrhea chlamydia      PLAN:  1. There are no discharge medications for this patient.    2.   Follow-up Information    None       Return to ED if worse     Diagnosis     Clinical Impression: Arthritis

## 2019-08-30 NOTE — ED Provider Notes (Signed)
ED Provider Notes by Clint Lipps, MD at 08/30/19 1722                Author: Clint Lipps, MD  Service: --  Author Type: Physician       Filed: 08/30/19 1725  Date of Service: 08/30/19 1722  Status: Signed          Editor: Clint Lipps, MD (Physician)               EMERGENCY DEPARTMENT HISTORY AND PHYSICAL EXAM           Date: 08/30/2019   Patient Name: Wesley Sanders        History of Presenting Illness          Chief Complaint       Patient presents with        ?  Penile Discharge           History Provided By: Patient      HPI: Wesley Sanders,  28 y.o. male with a past medical history significant  No significant past medical history presents to the ED with cc of yellow penile discharge and dysuria beginning 3 days ago.  Unprotected intercourse      There are no other complaints, changes, or physical findings at this time.      PCP: None        No current facility-administered medications on file prior to encounter.           No current outpatient medications on file prior to encounter.             Past History        Past Medical History:   History reviewed. No pertinent past medical history.      Past Surgical History:   History reviewed. No pertinent surgical history.      Family History:   History reviewed. No pertinent family history.      Social History:     Social History          Tobacco Use         ?  Smoking status:  Current Every Day Smoker              Packs/day:  0.25         ?  Smokeless tobacco:  Never Used       Substance Use Topics         ?  Alcohol use:  Yes             Comment: 3x a week         ?  Drug use:  Never           Allergies:   No Known Allergies           Review of Systems     All other systems negative   Review of Systems        Physical Exam     Young African-American male no acute distress   Physical Exam   Vitals signs and nursing note reviewed.   Constitutional:        General: He is not in acute distress.     Appearance: Normal appearance. He is not ill-appearing or  toxic-appearing.    HENT:       Head: Normocephalic and atraumatic.      Nose: Nose normal.      Mouth/Throat:      Mouth: Mucous membranes are moist.  Eyes:       Extraocular Movements: Extraocular movements intact.      Conjunctiva/sclera: Conjunctivae normal.      Pupils: Pupils are equal, round, and reactive to light.   Neck:       Musculoskeletal: Normal range of motion and neck supple. No neck rigidity or muscular tenderness.      Vascular: No carotid bruit.    Cardiovascular:       Rate and Rhythm: Normal rate and regular rhythm.      Pulses: Normal pulses.      Heart sounds: Normal heart sounds.    Pulmonary:       Effort: Pulmonary effort is normal. No respiratory distress.      Breath sounds: Normal breath sounds. No wheezing, rhonchi or rales.    Abdominal:      General: Abdomen is flat. There is no distension.      Palpations: Abdomen is soft. There is no mass.      Tenderness: There is no abdominal tenderness. There is no right  CVA tenderness, left CVA tenderness, guarding or rebound.      Hernia: No hernia is present.    Genitourinary :      Comments: Purulent yellow penile discharge nontender epididymis testicles  Musculoskeletal: Normal range of motion.    Skin:      General: Skin is warm and dry.   Neurological :       General: No focal deficit present.      Mental Status: He is alert and oriented to person, place, and time. Mental status is at baseline.   Psychiatric:         Mood and Affect: Mood normal.         Behavior: Behavior normal.         Thought Content:  Thought content normal.               Diagnostic Study Results        Labs -    No results found for this or any previous visit (from the past 12 hour(s)).      Radiologic Studies -    @lastxrresult @     CT Results  (Last 48 hours)          None                 CXR Results  (Last 48 hours)          None                       Medical Decision Making     I am the first provider for this patient.      I reviewed the vital signs,  available nursing notes, past medical history, past surgical history, family history and social history.      Vital Signs-Reviewed the patient's vital signs.   Patient Vitals for the past 12 hrs:            Temp  Pulse  Resp  BP  SpO2            08/30/19 1612  98.1 ??F (36.7 ??C)  75  16  (!) 130/92  99 %           Records Reviewed: Nursing Notes      The patient presents with urethritis with a differential diagnosis of gonorrhea chlamydia         Provider Notes (Medical Decision Making):  Unprotected intercourse now with urethritis gonorrhea versus chlamydia   MDM             ED Course:    Initial assessment performed. The patients presenting problems have been discussed, and they are in agreement with the care plan formulated and outlined with them.  I have encouraged them to ask questions as they arise throughout their visit.                PROCEDURES   Procedures    We will treat for gonorrhea chlamydia         PLAN:   1. There are no discharge medications for this patient.      2.      Follow-up Information      None             Return to ED if worse         Diagnosis        Clinical Impression: Arthritis

## 2019-08-30 NOTE — ED Notes (Signed)
Patient left ED ambulatory in good condition.  All discharge instructions were printed and given to patient and verbal education was performed.
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
## 2019-09-02 LAB — CHLAMYDIA / GC-AMPLIFIED
CHLAMYDIA TRACHOMATIS, NAA, 188078: NEGATIVE
Chlamydia trachomatis, NAA: NEGATIVE
NEISSERIA GONORRHOEAE, NAA, 188086: POSITIVE — AB
Neisseria gonorrhoeae, NAA: POSITIVE — AB

## 2022-08-12 DIAGNOSIS — S61255A Open bite of left ring finger without damage to nail, initial encounter: Secondary | ICD-10-CM
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
## 2022-08-12 NOTE — ED Notes (Signed)
Discharge instructions were given to the patient by Ozzie Hoyle, RN    The patient left the Emergency Department ambulatory, alert and oriented and in no acute distress with 1 prescriptions. The patient was encouraged to call or return to the ED for worsening issues or problems and was encouraged to schedule a follow up appointment for continuing care.    The patient verbalized understanding of discharge instructions and prescriptions, all questions were answered. The patient has no further concerns at this time.         Sheffield Slider, RN  08/12/22 2147
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
## 2022-08-13 ENCOUNTER — Inpatient Hospital Stay
Admit: 2022-08-13 | Discharge: 2022-08-13 | Disposition: A | Discharge status: Home / Self Care | Payer: PRIVATE HEALTH INSURANCE

## 2022-08-13 MED ORDER — AMOXICILLIN-POT CLAVULANATE 875-125 MG PO TABS
875-125 MG | ORAL_TABLET | Freq: Two times a day (BID) | ORAL | 0 refills | Status: AC
Start: 2022-08-13 — End: 2022-08-22

## 2023-01-19 ENCOUNTER — Emergency Department: Admit: 2023-01-20 | Payer: PRIVATE HEALTH INSURANCE

## 2023-01-19 DIAGNOSIS — S52225D Nondisplaced transverse fracture of shaft of left ulna, subsequent encounter for closed fracture with routine healing: Secondary | ICD-10-CM

## 2023-01-19 DIAGNOSIS — S52692A Other fracture of lower end of left ulna, initial encounter for closed fracture: Secondary | ICD-10-CM
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
## 2023-01-19 NOTE — Other (Signed)
I have reviewed discharge instructions with the patient and PD.  The patient and PD verbalized understanding.
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
## 2023-01-20 ENCOUNTER — Inpatient Hospital Stay
Admit: 2023-01-20 | Discharge: 2023-01-20 | Disposition: A | Discharge status: Home / Self Care | Payer: PRIVATE HEALTH INSURANCE | Attending: Emergency Medicine

## 2023-01-20 MED ORDER — IBUPROFEN 800 MG PO TABS
800 | ORAL | Status: DC
Start: 2023-01-20 — End: 2023-01-20

## 2023-01-20 MED FILL — IBUPROFEN 800 MG PO TABS: 800 MG | ORAL | Qty: 1

## 2023-05-18 ENCOUNTER — Ambulatory Visit: Admit: 2023-05-18 | Payer: PRIVATE HEALTH INSURANCE

## 2023-05-18 ENCOUNTER — Ambulatory Visit: Admit: 2023-05-18 | Discharge: 2023-05-18 | Payer: PRIVATE HEALTH INSURANCE | Attending: Orthopaedic Surgery

## 2023-05-18 VITALS — Ht 73.0 in | Wt 180.0 lb

## 2023-05-18 DIAGNOSIS — M79632 Pain in left forearm: Principal | ICD-10-CM

## 2023-05-18 MED ORDER — KETOROLAC TROMETHAMINE 10 MG PO TABS
10 | ORAL_TABLET | Freq: Four times a day (QID) | ORAL | 0 refills | Status: AC | PRN
Start: 2023-05-18 — End: 2024-05-17
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
